# Patient Record
Sex: Male | Born: 1990 | Race: Black or African American | Hispanic: No | Marital: Single
Health system: Midwestern US, Community
[De-identification: ages and names within clinical notes are randomized; demographics above are authoritative.]

## PROBLEM LIST (undated history)

## (undated) DIAGNOSIS — R7989 Other specified abnormal findings of blood chemistry: Secondary | ICD-10-CM

## (undated) DIAGNOSIS — J45909 Unspecified asthma, uncomplicated: Secondary | ICD-10-CM

## (undated) DIAGNOSIS — E876 Hypokalemia: Secondary | ICD-10-CM

## (undated) DIAGNOSIS — G4452 New daily persistent headache (NDPH): Secondary | ICD-10-CM

---
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
## 2009-01-05 ENCOUNTER — Emergency Department (HOSPITAL_BASED_OUTPATIENT_CLINIC_OR_DEPARTMENT_OTHER): Admission: EM | Admit: 2009-01-05 | Discharge: 2009-01-05 | Payer: Self-pay | Admitting: Emergency Medicine

## 2009-03-07 ENCOUNTER — Emergency Department (HOSPITAL_BASED_OUTPATIENT_CLINIC_OR_DEPARTMENT_OTHER): Admission: EM | Admit: 2009-03-07 | Discharge: 2009-03-07 | Payer: Self-pay | Admitting: Emergency Medicine

## 2009-09-18 ENCOUNTER — Emergency Department (HOSPITAL_BASED_OUTPATIENT_CLINIC_OR_DEPARTMENT_OTHER): Admission: EM | Admit: 2009-09-18 | Discharge: 2009-09-18 | Payer: Self-pay | Admitting: Emergency Medicine

## 2010-12-06 LAB — POCT TOXICOLOGY PANEL: Tetrahydrocannabinol: POSITIVE

## 2013-04-14 ENCOUNTER — Encounter (HOSPITAL_BASED_OUTPATIENT_CLINIC_OR_DEPARTMENT_OTHER): Payer: Self-pay | Admitting: *Deleted

## 2013-04-14 ENCOUNTER — Emergency Department (HOSPITAL_BASED_OUTPATIENT_CLINIC_OR_DEPARTMENT_OTHER)
Admission: EM | Admit: 2013-04-14 | Discharge: 2013-04-14 | Disposition: A | Discharge status: Home / Self Care | Payer: 59 | Attending: Emergency Medicine | Admitting: Emergency Medicine

## 2013-04-14 DIAGNOSIS — W2209XA Striking against other stationary object, initial encounter: Secondary | ICD-10-CM | POA: Insufficient documentation

## 2013-04-14 DIAGNOSIS — Y929 Unspecified place or not applicable: Secondary | ICD-10-CM | POA: Insufficient documentation

## 2013-04-14 DIAGNOSIS — S060X0A Concussion without loss of consciousness, initial encounter: Secondary | ICD-10-CM | POA: Insufficient documentation

## 2013-04-14 DIAGNOSIS — S0993XA Unspecified injury of face, initial encounter: Secondary | ICD-10-CM | POA: Insufficient documentation

## 2013-04-14 DIAGNOSIS — Y9389 Activity, other specified: Secondary | ICD-10-CM | POA: Insufficient documentation

## 2013-04-14 NOTE — ED Notes (Signed)
Pt c/o h/a x 1 week after hitting head on wall, denies LOC

## 2013-04-14 NOTE — ED Provider Notes (Signed)
CSN: [id number]     Arrival date & time 04/14/13  1635 History     This chart was scribed for Shanna Cisco, MD by Jiles Prows, ED Scribe. The patient was seen in room MH10/MH10 and the patient's care was started at 4:57 PM.      Chief Complaint  Patient presents with  . Headache   Patient is a 22 y.o. male presenting with headaches. The history is provided by the patient and medical records. No language interpreter was used.  Headache Pain location:  L temporal and frontal Radiates to:  Does not radiate Duration:  1 week Chronicity:  New Relieved by:  Nothing Associated symptoms: no abdominal pain, no back pain, no congestion, no cough, no diarrhea, no dizziness, no pain, no fatigue, no fever, no nausea, no numbness, no photophobia and no vomiting    HPI Comments: Troy Castillo is a 22 y.o. male who presents to the Emergency Department complaining of Headache onset 6 days ago.  He states he dropped something, and hit his head on the frontal region on a wall while standing up.  He denies LOC.  He claims headache for 2 days after, and states it still does not feel normal.  He denies difficulty concentrating, but reports that "something isn't right."  Pt reports taking aleve and tylenol and with some relief.  He also complains of left ear pain onset 6 days ago.  He reports associated sore throat 5 days ago which has since resolved after taking medicine.  He claims he gets sore throats frequently.  Pt denies recent sick contacts.  Pt denies playing sports, but states he has been lifting weights.  Pt denies headache, diaphoresis, fever, chills, nausea, vomiting, diarrhea, numbness, weakness, cough, SOB and any other pain.   History reviewed. No pertinent past medical history. History reviewed. No pertinent past surgical history. History reviewed. No pertinent family history. History  Substance Use Topics  . Smoking status: Never Smoker   . Smokeless tobacco: Not on file  . Alcohol Use:  No    Review of Systems  Constitutional: Negative for fever, activity change, appetite change and fatigue.  HENT: Negative for congestion, facial swelling, rhinorrhea and trouble swallowing.   Eyes: Negative for photophobia and pain.  Respiratory: Negative for cough, chest tightness and shortness of breath.   Cardiovascular: Negative for chest pain and leg swelling.  Gastrointestinal: Negative for nausea, vomiting, abdominal pain, diarrhea and constipation.  Endocrine: Negative for polydipsia and polyuria.  Genitourinary: Negative for dysuria, urgency, decreased urine volume and difficulty urinating.  Musculoskeletal: Negative for back pain and gait problem.  Skin: Negative for color change, rash and wound.  Allergic/Immunologic: Negative for immunocompromised state.  Neurological: Positive for headaches. Negative for dizziness, facial asymmetry, speech difficulty, weakness and numbness.  Psychiatric/Behavioral: Negative for confusion, decreased concentration and agitation.   Allergies  Review of patient's allergies indicates no known allergies.  Home Medications  No current outpatient prescriptions on file. BP 137/79  Pulse 58  Temp(Src) 98.2 F (36.8 C) (Oral)  Resp 16  Ht 5\' 9"  (1.753 m)  Wt 150 lb (68.04 kg)  BMI 22.14 kg/m2  SpO2 100% Physical Exam  Constitutional: He is oriented to person, place, and time. He appears well-developed and well-nourished. No distress.  HENT:  Head: Normocephalic and atraumatic.  Right Ear: External ear normal.  Left Ear: External ear normal.  Mouth/Throat: No oropharyngeal exudate.  Eyes: Pupils are equal, round, and reactive to light.  Neck: Normal range  of motion. Neck supple.  Cardiovascular: Normal rate, regular rhythm and normal heart sounds.  Exam reveals no gallop and no friction rub.   No murmur heard. Pulmonary/Chest: Effort normal and breath sounds normal. No respiratory distress. He has no wheezes. He has no rales.  Abdominal:  Soft. Bowel sounds are normal. He exhibits no distension and no mass. There is no tenderness. There is no rebound and no guarding.  Musculoskeletal: Normal range of motion. He exhibits no edema and no tenderness.  Neurological: He is alert and oriented to person, place, and time. No cranial nerve deficit.  Skin: Skin is warm and dry.  Psychiatric: He has a normal mood and affect.   ED Course   Procedures (including critical care time) DIAGNOSTIC STUDIES: Filed Vitals:   04/14/13 1639  BP: 137/79  Pulse: 58  Temp: 98.2 F (36.8 C)  TempSrc: Oral  Resp: 16  Height: 5\' 9"  (1.753 m)  Weight: 150 lb (68.04 kg)  SpO2: 100%   COORDINATION OF CARE: 5:04 PM - Discussed ED treatment with pt at bedside including mild concussion discussion and pt agrees.  Advised that the best treatment for concussion is rest and time.  Advised pt to resume normal activities if he feels fine in a week.  Advised to follow up with PCP in a week if he feels worse.  Advised to follow up if he has numbness, weakness, or other specific changes.  Suggested tylenol for returned headache.  Labs Reviewed - No data to display No results found. No diagnosis found.  MDM   Pt is a 22 y.o. male with Pmhx as above who presents with h/a after hitting head on wall 6 days ago. H/a resolved, but pt "doesn't feel right", is worried he has a concussion.  No LOC, no focal neuro findings on exam.  Also complaining of R ear pain w/ nml external ear, canal & TM.  I do not believe he is likely to have had an acute intracranial bleed or skull fx from fall and imaging not needed.  He may have mild concussion.  Will rec avoiding contact sports, video games, strenuous reading/computer use for 1 week.  Return precautions given for new or worsening symptoms.   1. Concussion, without loss of consciousness, initial encounter      I personally performed the services described in this documentation, which was scribed in my presence. The  recorded information has been reviewed and is accurate.  Shanna Cisco, MD 04/15/13 [phone]

## 2014-08-30 ENCOUNTER — Emergency Department (HOSPITAL_BASED_OUTPATIENT_CLINIC_OR_DEPARTMENT_OTHER): Payer: 59

## 2014-08-30 ENCOUNTER — Encounter (HOSPITAL_BASED_OUTPATIENT_CLINIC_OR_DEPARTMENT_OTHER): Payer: Self-pay | Admitting: *Deleted

## 2014-08-30 ENCOUNTER — Emergency Department (HOSPITAL_BASED_OUTPATIENT_CLINIC_OR_DEPARTMENT_OTHER)
Admission: EM | Admit: 2014-08-30 | Discharge: 2014-08-30 | Disposition: A | Discharge status: Home / Self Care | Payer: 59 | Attending: Emergency Medicine | Admitting: Emergency Medicine

## 2014-08-30 DIAGNOSIS — J45901 Unspecified asthma with (acute) exacerbation: Secondary | ICD-10-CM | POA: Diagnosis not present

## 2014-08-30 DIAGNOSIS — J069 Acute upper respiratory infection, unspecified: Secondary | ICD-10-CM | POA: Diagnosis not present

## 2014-08-30 DIAGNOSIS — Z79899 Other long term (current) drug therapy: Secondary | ICD-10-CM | POA: Diagnosis not present

## 2014-08-30 DIAGNOSIS — Z720 Tobacco use: Secondary | ICD-10-CM | POA: Insufficient documentation

## 2014-08-30 DIAGNOSIS — R0602 Shortness of breath: Secondary | ICD-10-CM | POA: Diagnosis present

## 2014-08-30 HISTORY — DX: Unspecified asthma, uncomplicated: J45.909

## 2014-08-30 LAB — D-DIMER, QUANTITATIVE: D-Dimer, Quant: <0.27 ug/mL-FEU (ref 0.00–0.48)

## 2014-08-30 MED ORDER — ALBUTEROL SULFATE (2.5 MG/3ML) 0.083% IN NEBU
2.5000 mg | INHALATION_SOLUTION | Freq: Once | RESPIRATORY_TRACT | Status: AC
  Administered 2014-08-30: 2.5 mg via RESPIRATORY_TRACT
  Filled 2014-08-30: qty 3

## 2014-08-30 MED ORDER — ALBUTEROL SULFATE HFA 108 (90 BASE) MCG/ACT IN AERS: 1.0000 | INHALATION_SPRAY | Freq: Four times a day (QID) | RESPIRATORY_TRACT | Status: AC | PRN

## 2014-08-30 MED ORDER — IPRATROPIUM-ALBUTEROL 0.5-2.5 (3) MG/3ML IN SOLN
3.0000 mL | Freq: Once | RESPIRATORY_TRACT | Status: AC
  Administered 2014-08-30: 3 mL via RESPIRATORY_TRACT
  Filled 2014-08-30: qty 3

## 2014-08-30 NOTE — ED Provider Notes (Signed)
CSN: [id number]     Arrival date & time 08/30/14  1916 History   First MD Initiated Contact with Patient 08/30/14 2123     Chief Complaint  Patient presents with  . URI   Troy Castillo is a 24 y.o. otherwise healthy male who presents to the emergency department complaining of one week of chest tightness with associated shortness of breath. The patient reports that he feels like his center of his chest is slightly tight and he feels he needs to take a deep breath to breathe. He reports his symptoms of been intermittent for the past week. He reports his symptoms are worse in a hot or stuffy room. The patient reports he flew in from [place] earlier today, but was having these symptoms prior to the flight. The patient is not a smoker. The patient denies leg swelling. Patient denies personal or family history of blood clotting disorders such as factor V Leiden, protein C or S deficiency. Patient denies personal or family history of cardiovascular disease. The patient denies fevers, chills, cough, palpitations, nasal congestion, postnasal drip, ear pain, eye pain, abdominal pain, nausea, vomiting, diarrhea, rashes, or sick contacts.  (Consider location/radiation/quality/duration/timing/severity/associated sxs/prior Treatment) HPI  Past Medical History  Diagnosis Date  . Asthma    History reviewed. No pertinent past surgical history. No family history on file. History  Substance Use Topics  . Smoking status: Current Some Day Smoker  . Smokeless tobacco: Not on file  . Alcohol Use: Yes     Comment: occasionally    Review of Systems  Constitutional: Negative for fever and chills.  HENT: Negative for congestion, ear pain, postnasal drip, rhinorrhea, sinus pressure, sore throat and trouble swallowing.   Eyes: Negative for visual disturbance.  Respiratory: Positive for chest tightness and shortness of breath. Negative for cough, wheezing and stridor.   Cardiovascular: Negative for chest pain,  palpitations and leg swelling.  Gastrointestinal: Negative for nausea, vomiting, abdominal pain and diarrhea.  Genitourinary: Negative for dysuria.  Musculoskeletal: Negative for back pain and neck pain.  Skin: Negative for rash.  Neurological: Negative for dizziness, weakness, light-headedness and headaches.  All other systems reviewed and are negative.     Allergies  Review of patient's allergies indicates no known allergies.  Home Medications   Prior to Admission medications   Medication Sig Start Date End Date Taking? Authorizing Provider  albuterol (PROVENTIL HFA;VENTOLIN HFA) 108 (90 BASE) MCG/ACT inhaler Inhale 1-2 puffs into the lungs every 6 (six) hours as needed for wheezing or shortness of breath. 08/30/14   Einar Gip Keefer Soulliere, PA-C   BP 159/64 mmHg  Pulse 84  Temp(Src) 98.3 F (36.8 C) (Oral)  Resp 18  Ht  (1.753 m)  Wt 155 lb (70.308 kg)  BMI 22.88 kg/m2  SpO2 98% Physical Exam  Constitutional: He appears well-developed and well-nourished. No distress.  Nontoxic-appearing.  HENT:  Head: Normocephalic and atraumatic.  Right Ear: External ear normal.  Left Ear: External ear normal.  Nose: Nose normal.  Mouth/Throat: Oropharynx is clear and moist. No oropharyngeal exudate.  Bilateral tympanic membranes are pearly gray without erythema or loss of landmarks.  Eyes: Conjunctivae are normal. Pupils are equal, round, and reactive to light. Right eye exhibits no discharge. Left eye exhibits no discharge.  Neck: Normal range of motion. Neck supple.  Cardiovascular: Normal rate, regular rhythm, normal heart sounds and intact distal pulses.  Exam reveals no gallop and no friction rub.   No murmur heard. Bilateral radial pulses  are intact. Bilateral posterior tibialis pulses are intact.  Pulmonary/Chest: Effort normal and breath sounds normal. No respiratory distress. He has no wheezes. He has no rales. He exhibits no tenderness.  Patient's lungs are clear to  auscultation bilaterally.  Abdominal: Soft. He exhibits no distension and no mass. There is no tenderness. There is no rebound and no guarding.  Musculoskeletal: He exhibits no edema or tenderness.  No lower extremity edema. No calf tenderness.  Lymphadenopathy:    He has no cervical adenopathy.  Neurological: He is alert. Coordination normal.  Skin: Skin is warm and dry. No rash noted. He is not diaphoretic. No erythema. No pallor.  Psychiatric: He has a normal mood and affect. His behavior is normal.  Nursing note and vitals reviewed.   ED Course  Procedures (including critical care time) Labs Review Labs Reviewed  D-DIMER, QUANTITATIVE    Imaging Review Dg Chest 2 View  08/30/2014   CLINICAL DATA:  Acute onset of shortness of breath and chest tightness. Initial encounter.  EXAM: CHEST  2 VIEW  COMPARISON:  None.  FINDINGS: The lungs are well-aerated and clear. There is no evidence of focal opacification, pleural effusion or pneumothorax.  The heart is normal in size; the mediastinal contour is within normal limits. No acute osseous abnormalities are seen.  IMPRESSION: No acute cardiopulmonary process seen.   Electronically Signed   By: Roanna Raider M.D.   On: 08/30/2014 21:54     EKG Interpretation   Date/Time:  Sunday August 30 2014 19:27:14 EST Ventricular Rate:  61 PR Interval:  154 QRS Duration: 98 QT Interval:  402 QTC Calculation: 404 R Axis:   25 Text Interpretation:  Sinus rhythm with marked sinus arrhythmia Incomplete  right bundle branch block Borderline ECG Confirmed by BEATON  MD, ROBERT  ([id number]) on 08/30/2014 9:59:02 PM      Filed Vitals:   08/30/14 1932 08/30/14 2048 08/30/14 2320  BP: 148/75  159/64  Pulse: 59  84  Temp: 98.3 F (36.8 C)    TempSrc: Oral    Resp: 18  18  Height:  (1.753 m)    Weight: 155 lb (70.308 kg)    SpO2: 100% 98% 98%     MDM   Meds given in ED:  Medications  ipratropium-albuterol (DUONEB) 0.5-2.5 (3) MG/3ML  nebulizer solution 3 mL (3 mLs Nebulization Given 08/30/14 2033)  albuterol (PROVENTIL) (2.5 MG/3ML) 0.083% nebulizer solution 2.5 mg (2.5 mg Nebulization Given 08/30/14 2033)    Discharge Medication List as of 08/30/2014 11:00 PM    START taking these medications   Details  albuterol (PROVENTIL HFA;VENTOLIN HFA) 108 (90 BASE) MCG/ACT inhaler Inhale 1-2 puffs into the lungs every 6 (six) hours as needed for wheezing or shortness of breath., Starting 08/30/2014, Until Discontinued, Print        Final diagnoses:  Upper respiratory infection   This a 24 year old male who presented to the emergency complaining of shortness of breath and chest tightness intermittently for the past week. The patient denies other cold symptoms. Patient has no DVT or PE risk factors. The patient reported minimal improvement with nebulizer treatment. Patient is afebrile and nontoxic-appearing. The patient is not tachypneic or tachycardic. Patient is not hypoxic. The patient's lungs are clear to auscultation bilaterally. The patient's EKG indicated a sinus rhythm with sinus arrhythmia. The patient's chest x-ray is unremarkable. Patient has a negative d-dimer. Patient's symptoms are likely due to viral illness. Patient would like albuterol inhaler to have at home.  The patient's prescribed albuterol inhaler. I advised the patient to follow-up with their primary care provider this week. I advised the patient to return to the emergency department with new or worsening symptoms or new concerns. The patient verbalized understanding and agreement with plan.       Lawana Chambers, PA-C 08/31/14 0150  Nelia Shi, MD 09/03/14 1320

## 2014-08-30 NOTE — Discharge Instructions (Signed)
Upper Respiratory Infection, Adult °An upper respiratory infection (URI) is also sometimes known as the common cold. The upper respiratory tract includes the nose, sinuses, throat, trachea, and bronchi. Bronchi are the airways leading to the lungs. Most people improve within 1 week, but symptoms can last up to 2 weeks. A residual cough may last even longer.  °CAUSES °Many different viruses can infect the tissues lining the upper respiratory tract. The tissues become irritated and inflamed and often become very moist. Mucus production is also common. A cold is contagious. You can easily spread the virus to others by oral contact. This includes kissing, sharing a glass, coughing, or sneezing. Touching your mouth or nose and then touching a surface, which is then touched by another person, can also spread the virus. °SYMPTOMS  °Symptoms typically develop 1 to 3 days after you come in contact with a cold virus. Symptoms vary from person to person. They may include: °· Runny nose. °· Sneezing. °· Nasal congestion. °· Sinus irritation. °· Sore throat. °· Loss of voice (laryngitis). °· Cough. °· Fatigue. °· Muscle aches. °· Loss of appetite. °· Headache. °· Low-grade fever. °DIAGNOSIS  °You might diagnose your own cold based on familiar symptoms, since most people get a cold 2 to 3 times a year. Your caregiver can confirm this based on your exam. Most importantly, your caregiver can check that your symptoms are not due to another disease such as strep throat, sinusitis, pneumonia, asthma, or epiglottitis. Blood tests, throat tests, and X-rays are not necessary to diagnose a common cold, but they may sometimes be helpful in excluding other more serious diseases. Your caregiver will decide if any further tests are required. °RISKS AND COMPLICATIONS  °You may be at risk for a more severe case of the common cold if you smoke cigarettes, have chronic heart disease (such as heart failure) or lung disease (such as asthma), or if  you have a weakened immune system. The very young and very old are also at risk for more serious infections. Bacterial sinusitis, middle ear infections, and bacterial pneumonia can complicate the common cold. The common cold can worsen asthma and chronic obstructive pulmonary disease (COPD). Sometimes, these complications can require emergency medical care and may be life-threatening. °PREVENTION  °The best way to protect against getting a cold is to practice good hygiene. Avoid oral or hand contact with people with cold symptoms. Wash your hands often if contact occurs. There is no clear evidence that vitamin C, vitamin E, echinacea, or exercise reduces the chance of developing a cold. However, it is always recommended to get plenty of rest and practice good nutrition. °TREATMENT  °Treatment is directed at relieving symptoms. There is no cure. Antibiotics are not effective, because the infection is caused by a virus, not by bacteria. Treatment may include: °· Increased fluid intake. Sports drinks offer valuable electrolytes, sugars, and fluids. °· Breathing heated mist or steam (vaporizer or shower). °· Eating chicken soup or other clear broths, and maintaining good nutrition. °· Getting plenty of rest. °· Using gargles or lozenges for comfort. °· Controlling fevers with ibuprofen or acetaminophen as directed by your caregiver. °· Increasing usage of your inhaler if you have asthma. °Zinc gel and zinc lozenges, taken in the first 24 hours of the common cold, can shorten the duration and lessen the severity of symptoms. Pain medicines may help with fever, muscle aches, and throat pain. A variety of non-prescription medicines are available to treat congestion and runny nose. Your caregiver   can make recommendations and may suggest nasal or lung inhalers for other symptoms.  HOME CARE INSTRUCTIONS   Only take over-the-counter or prescription medicines for pain, discomfort, or fever as directed by your  caregiver.  Use a warm mist humidifier or inhale steam from a shower to increase air moisture. This may keep secretions moist and make it easier to breathe.  Drink enough water and fluids to keep your urine clear or pale yellow.  Rest as needed.  Return to work when your temperature has returned to normal or as your caregiver advises. You may need to stay home longer to avoid infecting others. You can also use a face mask and careful hand washing to prevent spread of the virus. SEEK MEDICAL CARE IF:   After the first few days, you feel you are getting worse rather than better.  You need your caregiver's advice about medicines to control symptoms.  You develop chills, worsening shortness of breath, or brown or red sputum. These may be signs of pneumonia.  You develop yellow or brown nasal discharge or pain in the face, especially when you bend forward. These may be signs of sinusitis.  You develop a fever, swollen neck glands, pain with swallowing, or white areas in the back of your throat. These may be signs of strep throat. SEEK IMMEDIATE MEDICAL CARE IF:   You have a fever.  You develop severe or persistent headache, ear pain, sinus pain, or chest pain.  You develop wheezing, a prolonged cough, cough up blood, or have a change in your usual mucus (if you have chronic lung disease).  You develop sore muscles or a stiff neck. Document Released: 02/07/2001 Document Revised: 11/06/2011 Document Reviewed: 11/19/2013 [facility] Patient Information 2015 [place], [place]. This information is not intended to replace advice given to you by your health care provider. Make sure you discuss any questions you have with your health care provider. Shortness of Breath Shortness of breath means you have trouble breathing. It could also mean that you have a medical problem. You should get immediate medical care for shortness of breath. CAUSES   Not enough oxygen in the air such as with high altitudes  or a smoke-filled room.  Certain lung diseases, infections, or problems.  Heart disease or conditions, such as angina or heart failure.  Low red blood cells (anemia).  Poor physical fitness, which can cause shortness of breath when you exercise.  Chest or back injuries or stiffness.  Being overweight.  Smoking.  Anxiety, which can make you feel like you are not getting enough air. DIAGNOSIS  Serious medical problems can often be found during your physical exam. Tests may also be done to determine why you are having shortness of breath. Tests may include:  Chest X-rays.  Lung function tests.  Blood tests.  An electrocardiogram (ECG).  An ambulatory electrocardiogram. An ambulatory ECG records your heartbeat patterns over a 24-hour period.  Exercise testing.  A transthoracic echocardiogram (TTE). During echocardiography, sound waves are used to evaluate how blood flows through your heart.  A transesophageal echocardiogram (TEE).  Imaging scans. Your health care provider may not be able to find a cause for your shortness of breath after your exam. In this case, it is important to have a follow-up exam with your health care provider as directed.  TREATMENT  Treatment for shortness of breath depends on the cause of your symptoms and can vary greatly. HOME CARE INSTRUCTIONS   Do not smoke. Smoking is a common cause  of shortness of breath. If you smoke, ask for help to quit.  Avoid being around chemicals or things that may bother your breathing, such as paint fumes and dust.  Rest as needed. Slowly resume your usual activities.  If medicines were prescribed, take them as directed for the full length of time directed. This includes oxygen and any inhaled medicines.  Keep all follow-up appointments as directed by your health care provider. SEEK MEDICAL CARE IF:   Your condition does not improve in the time expected.  You have a hard time doing your normal activities even  with rest.  You have any new symptoms. SEEK IMMEDIATE MEDICAL CARE IF:   Your shortness of breath gets worse.  You feel light-headed, faint, or develop a cough not controlled with medicines.  You start coughing up blood.  You have pain with breathing.  You have chest pain or pain in your arms, shoulders, or abdomen.  You have a fever.  You are unable to walk up stairs or exercise the way you normally do. MAKE SURE YOU:  Understand these instructions.  Will watch your condition.  Will get help right away if you are not doing well or get worse. Document Released: 05/09/2001 Document Revised: 08/19/2013 Document Reviewed: 10/30/2011 [facility] Patient Information 2015 [place], [place]. This information is not intended to replace advice given to you by your health care provider. Make sure you discuss any questions you have with your health care provider.

## 2014-08-30 NOTE — ED Notes (Addendum)
C/o sob, chest tightness. Onset 1 week ago. (denies: cough, congestion, cold sx, fever, nvd, dizziness, leg pain or other sx), sx worse when in a hot or stuffy area. H/o asthma. occasional smoker. Recent flight from [place]. Alert, NAD, calm, interactive, resps e/u, speaking in clear complete sentences. LS CTA. No dyspnea noted.

## 2014-09-28 ENCOUNTER — Ambulatory Visit: Payer: Self-pay | Admitting: Cardiology

## 2015-01-27 ENCOUNTER — Encounter (HOSPITAL_COMMUNITY): Payer: Self-pay | Admitting: *Deleted

## 2015-01-27 ENCOUNTER — Emergency Department (HOSPITAL_COMMUNITY)
Admission: EM | Admit: 2015-01-27 | Discharge: 2015-01-27 | Disposition: A | Discharge status: Home / Self Care | Payer: 59 | Source: Home / Self Care | Attending: Family Medicine | Admitting: Family Medicine

## 2015-01-27 DIAGNOSIS — T148 Other injury of unspecified body region: Secondary | ICD-10-CM | POA: Diagnosis not present

## 2015-01-27 DIAGNOSIS — B070 Plantar wart: Secondary | ICD-10-CM

## 2015-01-27 DIAGNOSIS — W57XXXA Bitten or stung by nonvenomous insect and other nonvenomous arthropods, initial encounter: Secondary | ICD-10-CM

## 2015-01-27 MED ORDER — TRIAMCINOLONE ACETONIDE 0.1 % EX CREA: 1.0000 "application " | TOPICAL_CREAM | Freq: Two times a day (BID) | CUTANEOUS | Status: AC

## 2015-01-27 NOTE — Discharge Instructions (Signed)
Thank you for coming in today. Use a metatarsal pad for your foot.  I can help you place it.  Apply the cream on the bug bite.  Return as needed.    Insect Bite Mosquitoes, flies, fleas, bedbugs, and many other insects can bite. Insect bites are different from insect stings. A sting is when venom is injected into the skin. Some insect bites can transmit infectious diseases. SYMPTOMS  Insect bites usually turn red, swell, and itch for 2 to 4 days. They often go away on their own. TREATMENT  Your caregiver may prescribe antibiotic medicines if a bacterial infection develops in the bite. HOME CARE INSTRUCTIONS  Do not scratch the bite area.  Keep the bite area clean and dry. Wash the bite area thoroughly with soap and water.  Put ice or cool compresses on the bite area.  Put ice in a plastic bag.  Place a towel between your skin and the bag.  Leave the ice on for 20 minutes, 4 times a day for the first 2 to 3 days, or as directed.  You may apply a baking soda paste, cortisone cream, or calamine lotion to the bite area as directed by your caregiver. This can help reduce itching and swelling.  Only take over-the-counter or prescription medicines as directed by your caregiver.  If you are given antibiotics, take them as directed. Finish them even if you start to feel better. You may need a tetanus shot if:  You cannot remember when you had your last tetanus shot.  You have never had a tetanus shot.  The injury broke your skin. If you get a tetanus shot, your arm may swell, get red, and feel warm to the touch. This is common and not a problem. If you need a tetanus shot and you choose not to have one, there is a rare chance of getting tetanus. Sickness from tetanus can be serious. SEEK IMMEDIATE MEDICAL CARE IF:   You have increased pain, redness, or swelling in the bite area.  You see a red line on the skin coming from the bite.  You have a fever.  You have joint  pain.  You have a headache or neck pain.  You have unusual weakness.  You have a rash.  You have chest pain or shortness of breath.  You have abdominal pain, nausea, or vomiting.  You feel unusually tired or sleepy. MAKE SURE YOU:   Understand these instructions.  Will watch your condition.  Will get help right away if you are not doing well or get worse. Document Released: 09/21/2004 Document Revised: 11/06/2011 Document Reviewed: 03/15/2011 [facility] Patient Information 2015 [place], [place]. This information is not intended to replace advice given to you by your health care provider. Make sure you discuss any questions you have with your health care provider.  Plantar Warts Warts are benign (noncancerous) growths of the outer skin layer. They can occur at any time in life but are most common during childhood and the teen years. Warts can occur on many skin surfaces of the body. When they occur on the underside (sole) of your foot they are called plantar warts. They often emerge in groups with several small warts encircling a larger growth. CAUSES  Human papillomavirus (HPV) is the cause of plantar warts. HPV attacks a break in the skin of the foot. Walking barefoot can lead to exposure to the wart virus. Plantar warts tend to develop over areas of pressure such as the heel and ball  of the foot. Plantar warts often grow into the deeper layers of skin. They may spread to other areas of the sole but cannot spread to other areas of the body. SYMPTOMS  You may also notice a growth on the undersurface of your foot. The wart may grow directly into the sole of the foot, or rise above the surface of the skin on the sole of the foot, or both. They are most often flat from pressure. Warts generally do not cause itching but may cause pain in the area of the wart when you put weight on your foot. DIAGNOSIS  Diagnosis is made by physical examination. This means your caregiver discovers it while  examining your foot.  TREATMENT  There are many ways to treat plantar warts. However, warts are very tough. Sometimes it is difficult to treat them so that they go away completely and do not grow back. Any treatment must be done regularly to work. If left untreated, most plantar warts will eventually disappear over a period of one to two years. Treatments you can do at home include:  Putting duct tape over the top of the wart (occlusion) has been found to be effective over several months. The duct tape should be removed each night and reapplied until the wart has disappeared.  Placing over-the-counter medications on top of the wart to help kill the wart virus and remove the wart tissue (salicylic acid, cantharidin, and dichloroacetic acid) are useful. These are called keratolytic agents. These medications make the skin soft and gradually layers will shed away. These compounds are usually placed on the wart each night and then covered with a bandage. They are also available in premedicated bandage form. Avoid surrounding skin when applying these liquids as these medications can burn healthy skin. The treatment may take several months of nightly use to be effective.  Cryotherapy to freeze the wart has recently become available over-the-counter for children 4 years and older. This system makes use of a soft narrow applicator connected to a bottle of compressed cold liquid that is applied directly to the wart. This medication can burn healthy skin and should be used with caution.  As with all over-the-counter medications, read the directions carefully before use. Treatments generally done in your caregiver's office include:  Some aggressive treatments may cause discomfort, discoloration, and scarring of the surrounding skin. The risks and benefits of treatment should be discussed with your caregiver.  Freezing the wart with liquid nitrogen (cryotherapy, see above).  Burning the wart with use of very  high heat (cautery).  Injecting medication into the wart.  Surgically removing or laser treatment of the wart.  Your caregiver may refer you to a dermatologist for difficult to treat large-sized warts or large numbers of warts. HOME CARE INSTRUCTIONS   Soak the affected area in warm water. Dry the area completely when you are done. Remove the top layer of softened skin, then apply the chosen topical medication and reapply a bandage.  Remove the bandage daily and file excess wart tissue (pumice stone works well for this purpose). Repeat the entire process daily or every other day for weeks until the plantar wart disappears.  Several brands of salicylic acid pads are available as over-the-counter remedies.  Pain can be relieved by wearing a donut bandage. This is a bandage with a hole in it. The bandage is put on with the hole over the wart. This helps take the pressure off the wart and gives pain relief. To help prevent  plantar warts:  Wear shoes and socks and change them daily.  Keep feet clean and dry.  Check your feet and your children's feet regularly.  Avoid direct contact with warts on other people.  Have growths or changes on your skin checked by your caregiver. Document Released: 11/04/2003 Document Revised: 12/29/2013 Document Reviewed: 04/14/2009 [facility] Patient Information 2015 [place]. This information is not intended to replace advice given to you by your health care provider. Make sure you discuss any questions you have with your health care provider.

## 2015-01-27 NOTE — ED Notes (Signed)
Pt has  2  Issues he  Has  A  reddned  Rash  Area  On r  Upper     Chest  That  He  Noticed   sev  Days  Ago    He   Also  Reports  A  Blister  /callus   Bottom  Of  Left  Foot  That  Has  Been there  For     About  6  Months      Pt   Appears  In  No  Acute  Severe  Distress

## 2015-01-27 NOTE — ED Provider Notes (Signed)
Troy ApleyMatthew D Castillo is a 24 y.o. male who presents to Urgent Care today for rash on chest. Patient has a small irritated area on his right side of his chest present for the last few days. It is tingling. It does not hurt. No fevers or chills. No treatment tried yet.  Additionally he notes a painful area on his plantar foot. This is been present for 6 months and is tender with ambulation. No treatment tried yet.   Past Medical History  Diagnosis Date  . Asthma    History reviewed. No pertinent past surgical history. History  Substance Use Topics  . Smoking status: Current Some Day Smoker  . Smokeless tobacco: Not on file  . Alcohol Use: Yes     Comment: occasionally   ROS as above Medications: No current facility-administered medications for this encounter.   Current Outpatient Prescriptions  Medication Sig Dispense Refill  . albuterol (PROVENTIL HFA;VENTOLIN HFA) 108 (90 BASE) MCG/ACT inhaler Inhale 1-2 puffs into the lungs every 6 (six) hours as needed for wheezing or shortness of breath. 1 Inhaler 0  . triamcinolone cream (KENALOG) 0.1 % Apply 1 application topically 2 (two) times daily. 30 g 0   No Known Allergies   Exam:  BP 134/78 mmHg  Pulse 65  Temp(Src) 98.3 F (36.8 C) (Oral)  Resp 16  SpO2 98%   Gen: Well NAD HEENT: EOMI,  MMM Lungs: Normal work of breathing. CTABL Heart: RRR no MRG Abd: NABS, Soft. Nondistended, Nontender Exts: Brisk capillary refill, warm and well perfused.  Skin: Small erythematous papule right chest. Nontender. She once or induration. Foot: Plantar foot with plantar wart present at the skin just superficial to the second MTP. Mildly tender  No results found for this or any previous visit (from the past 24 hour(s)). No results found.  Assessment and Plan: 24 y.o. male with  1) insect bite likely right-sided chest. Treat with triamcinolone cream 2) plantar wart. Follow-up with podiatry  Discussed warning signs or symptoms. Please see  discharge instructions. Patient expresses understanding.     Rodolph BongEvan S Lelan Cush, MD 01/27/15 [phone]

## 2015-02-22 NOTE — ED Notes (Signed)
Patient called w questions about to whom he was referred on his visit. Called patient, and discussed

## 2015-03-04 ENCOUNTER — Ambulatory Visit (INDEPENDENT_AMBULATORY_CARE_PROVIDER_SITE_OTHER): Payer: 59 | Admitting: Podiatry

## 2015-03-04 ENCOUNTER — Encounter: Payer: Self-pay | Admitting: Podiatry

## 2015-03-04 VITALS — BP 121/85 | HR 61 | Resp 12

## 2015-03-04 DIAGNOSIS — B070 Plantar wart: Secondary | ICD-10-CM | POA: Diagnosis not present

## 2015-03-04 DIAGNOSIS — Q667 Congenital pes cavus: Secondary | ICD-10-CM

## 2015-03-04 DIAGNOSIS — B079 Viral wart, unspecified: Secondary | ICD-10-CM

## 2015-03-04 DIAGNOSIS — M216X9 Other acquired deformities of unspecified foot: Secondary | ICD-10-CM

## 2015-03-04 DIAGNOSIS — B078 Other viral warts: Secondary | ICD-10-CM

## 2015-03-04 NOTE — Progress Notes (Signed)
   Subjective:    Patient ID: Troy ApleyMatthew D Tonnesen, male    DOB: 08/13/1991, 24 y.o.   MRN: [id number]  HPI Patient presents callous on the bottom of L foot which has been giving patient pain for over a year. Went to urgent care for it a month ago and started wearing insoles which have alleviated some discomfort.   Review of Systems  Skin: Positive for color change.  All other systems reviewed and are negative.      Objective:   Physical Exam        Assessment & Plan:

## 2015-03-04 NOTE — Progress Notes (Signed)
Subjective:     Patient ID: Troy Castillo, male   DOB: 04/05/1991, 24 y.o.   MRN: [id number]  HPI patient states I been getting a lot of pain on the ball of my left foot and there is a lesion there and I'm not sure what caused it but it's been there around a year but worse recently   Review of Systems  All other systems reviewed and are negative.      Objective:   Physical Exam  Constitutional: He is oriented to person, place, and time.  Cardiovascular: Intact distal pulses.   Musculoskeletal: Normal range of motion.  Neurological: He is oriented to person, place, and time.  Skin: Skin is warm.  Nursing note and vitals reviewed.  neurovascular status intact muscle strength adequate range of motion within normal limits with lesion on the plantar aspect left second metatarsal that sore when pressed and makes walking difficult. It does have a lucent core but also has inflammation when I pressed from a lateral direction and large tissue formation surrounding it     Assessment:     Lesion secondary to position of the metatarsal with possible verruca plantaris present    Plan:     H&P and condition and education rendered to patient. Using sharp sterile instrumentation debridement accomplished and I applied sterile dressing around the area and applied a chemical agent to the lesion to try to kill any fibrous tissue. Patient will be seen back when it becomes symptomatic again

## 2017-07-30 ENCOUNTER — Ambulatory Visit
Admission: RE | Admit: 2017-07-30 | Discharge: 2017-07-30 | Disposition: A | Discharge status: Home / Self Care | Payer: No Typology Code available for payment source | Source: Ambulatory Visit | Attending: Internal Medicine | Admitting: Internal Medicine

## 2017-07-30 ENCOUNTER — Other Ambulatory Visit: Payer: 59

## 2017-07-30 ENCOUNTER — Other Ambulatory Visit: Payer: Self-pay | Admitting: Internal Medicine

## 2017-07-30 DIAGNOSIS — S0990XA Unspecified injury of head, initial encounter: Secondary | ICD-10-CM

## 2019-01-27 DIAGNOSIS — B9681 Helicobacter pylori [H. pylori] as the cause of diseases classified elsewhere: Secondary | ICD-10-CM

## 2019-02-11 ENCOUNTER — Telehealth
Admit: 2019-02-11 | Discharge: 2019-02-11 | Payer: PRIVATE HEALTH INSURANCE | Attending: Family Medicine | Primary: Family Medicine

## 2019-02-11 ENCOUNTER — Telehealth: Attending: Family Medicine | Primary: Family Medicine

## 2019-02-11 DIAGNOSIS — K2970 Gastritis, unspecified, without bleeding: Secondary | ICD-10-CM

## 2019-02-11 DIAGNOSIS — B9681 Helicobacter pylori [H. pylori] as the cause of diseases classified elsewhere: Secondary | ICD-10-CM

## 2019-02-11 NOTE — Progress Notes (Signed)
Yhair Pourciau presents today for   Chief Complaint   Patient presents with   . GI Problem     Follow up GI concerns, currently under tx H-pylori with Gastro.        Is someone accompanying this pt? No    Is the patient using any DME equipment during OV? no    Depression Screening:  3 most recent PHQ Screens 02/11/2019   Little interest or pleasure in doing things Not at all   Feeling down, depressed, irritable, or hopeless Not at all   Total Score PHQ 2 0       Learning Assessment:  No flowsheet data found.    Abuse Screening:  Abuse Screening Questionnaire 02/11/2019   Do you ever feel afraid of your partner? N   Are you in a relationship with someone who physically or mentally threatens you? N   Is it safe for you to go home? Y         Health Maintenance Due   Topic Date Due   . DTaP/Tdap/Td series (1 - Tdap) 05/29/2011   .      Health Maintenance reviewed and discussed and ordered per Provider.    Ingram Meece is updated on all HM    Coordination of Care  1. Have you been to the ER, urgent care clinic since your last visit?  Hospitalized since your last visit? New pt    2. Have you seen or consulted any other health care providers outside of the [facility] since your last visit?  Include any pap smears or colon screening. New pt; seen by gi.

## 2019-02-11 NOTE — Progress Notes (Signed)
Phillip Valdez is a 28 y.o. male who was seen by synchronous (real-time) audio-video technology on 02/11/2019.      Consent: Phillip Valdez, who was seen by synchronous (real-time) audio-video technology, and/or his healthcare decision maker, is aware that this patient-initiated, Telehealth encounter on 02/11/2019 is a billable service, with coverage as determined by his insurance carrier. He is aware that he may receive a bill and has provided verbal consent to proceed: Yes.       Assessment & Plan:   Diagnoses and all orders for this visit:    1. Helicobacter pylori gastritis  Cont triple antibiotic therapy as per gi.  Cont omeprazole as per gi.  Restart pepcid.  sched f/u with gi.       The complexity of medical decision making for this visit is moderate   Follow-up and Dispositions    ?? Return in about 4 weeks (around 03/11/2019) for gastritis.           712  Subjective:   Phillip Valdez is a 28 y.o. male who was seen for GI Problem (Follow up GI concerns, currently under tx H-pylori with Gastro. )    Patient contacted via [email or web address].      Pt had been seen at an urgent care for throat pain but cont to have abd sx.  He was seen by gi.  He did get an egd.  Pt was dx with h. Pylori.  He is on triple antibiotic tx.  His throat pain is now starting again.  Pt is not taking pepcid or protonix anymore.  Pt notes that his stomach pain has improved.  There is no burning sensation.  Pt has told gi that he would prefer to postpone colonoscopy for now.  Pt's next f/u with gi has not yet been set.      Prior to Admission medications    Medication Sig Start Date End Date Taking? Authorizing Provider   clarithromycin (BIAXIN) 500 mg tablet TK 1 T PO  Q 12 H FOR 14 DAYS TAT 02/05/19  Yes Provider, Historical   amoxicillin (AMOXIL) 500 mg capsule TK 2 CS PO Q 12 H FOR 14 DAYS TAT 02/05/19  Yes Provider, Historical   omeprazole (PRILOSEC) 20 mg capsule TK 1 C PO BID 30 MIN TO 1 HOUR AC FOR 2 WKS 02/05/19  Yes Provider, Historical    famotidine (PEPCID) 20 mg tablet TK 1 T PO BID 01/23/19   Provider, Historical     No Known Allergies    Patient Active Problem List   Diagnosis Code   ??? Asthma J45.909   ??? Allergic rhinitis J30.9   ??? Mild intermittent asthma J45.20   ??? Helicobacter pylori gastritis K29.70, B96.81     Current Outpatient Medications   Medication Sig Dispense Refill   ??? clarithromycin (BIAXIN) 500 mg tablet TK 1 T PO  Q 12 H FOR 14 DAYS TAT     ??? amoxicillin (AMOXIL) 500 mg capsule TK 2 CS PO Q 12 H FOR 14 DAYS TAT     ??? omeprazole (PRILOSEC) 20 mg capsule TK 1 C PO BID 30 MIN TO 1 HOUR AC FOR 2 WKS     ??? famotidine (PEPCID) 20 mg tablet TK 1 T PO BID       No Known Allergies  Past Medical History:   Diagnosis Date   ??? Allergic rhinitis    ??? Concussion    ??? Helicobacter pylori gastritis 01/2019   ??? Mild intermittent asthma    ???  Tension headache      History reviewed. No pertinent surgical history.  Family History   Problem Relation Age of Onset   ??? Migraines Mother    ??? No Known Problems Father    ??? No Known Problems Sister    ??? Colon Cancer Maternal Grandfather    ??? Breast Cancer Paternal Grandmother    ??? Alzheimer Paternal Grandfather      Social History     Tobacco Use   ??? Smoking status: Never Smoker   ??? Smokeless tobacco: Never Used   Substance Use Topics   ??? Alcohol use: Yes     Frequency: Monthly or less       Review of Systems   Constitutional: Negative.    HENT:        Burning sensation in throat   Respiratory: Negative.    Cardiovascular: Negative.    Gastrointestinal: Negative for abdominal pain and heartburn.   All other systems reviewed and are negative.        Objective:   There were no vitals taken for this visit.   General: alert, cooperative, no distress   Mental  status: normal mood, behavior, speech, dress, motor activity, and thought processes, able to follow commands   HENT: NCAT   Neck: no visualized mass   Resp: no respiratory distress   Neuro: no gross deficits   Skin: no discoloration or lesions of concern on  visible areas   Psychiatric: normal affect, consistent with stated mood, no evidence of hallucinations     Additional exam findings: none      We discussed the expected course, resolution and complications of the diagnosis(es) in detail.  Medication risks, benefits, costs, interactions, and alternatives were discussed as indicated.  I advised him to contact the office if his condition worsens, changes or fails to improve as anticipated. He expressed understanding with the diagnosis(es) and plan.     Phillip Valdez is a 28 y.o. male who was evaluated by a video visit encounter for concerns as above. Patient identification was verified prior to start of the visit. A caregiver was present when appropriate. Due to this being a Scientist, physiological (During ZOXWR-60 public health emergency), evaluation of the following organ systems was limited: Vitals/Constitutional/EENT/Resp/CV/GI/GU/MS/Neuro/Skin/Heme-Lymph-Imm.  Pursuant to the emergency declaration under the [place], 1135 waiver authority and the [employer] Act, this Virtual  Visit was conducted, with patient's (and/or legal guardian's) consent, to reduce the patient's risk of exposure to COVID-19 and provide necessary medical care.     Services were provided through a video synchronous discussion virtually to substitute for in-person clinic visit.  Patient and provider were located at their individual homes.      Albin Felling, MD

## 2019-02-11 NOTE — Progress Notes (Signed)
Phillip Valdez is a 28 y.o. male who was seen by synchronous (real-time) audio-video technology on 02/11/2019.      Consent: Phillip Valdez, who was seen by synchronous (real-time) audio-video technology, and/or his healthcare decision maker, is aware that this patient-initiated, Telehealth encounter on 02/11/2019 is a billable service, with coverage as determined by his insurance carrier. He is aware that he may receive a bill and has provided verbal consent to proceed: Yes.       Assessment & Plan:   Diagnoses and all orders for this visit:    1. Helicobacter pylori gastritis  Cont triple antibiotic therapy as per gi.  Cont omeprazole as per gi.  Restart pepcid.  sched f/u with gi.       The complexity of medical decision making for this visit is moderate   Follow-up and Dispositions    ?? Return in about 4 weeks (around 03/11/2019) for gastritis.           712  Subjective:   Phillip Valdez is a 28 y.o. male who was seen for GI Problem (Follow up GI concerns, currently under tx H-pylori with Gastro. )    Patient contacted via [email or web address].      Pt had been seen at an urgent care for throat pain but cont to have abd sx.  He was seen by gi.  He did get an egd.  Pt was dx with h. Pylori.  He is on triple antibiotic tx.  His throat pain is now starting again.  Pt is not taking pepcid or protonix anymore.  Pt notes that his stomach pain has improved.  There is no burning sensation.  Pt has told gi that he would prefer to postpone colonoscopy for now.  Pt's next f/u with gi has not yet been set.      Prior to Admission medications    Medication Sig Start Date End Date Taking? Authorizing Provider   clarithromycin (BIAXIN) 500 mg tablet TK 1 T PO  Q 12 H FOR 14 DAYS TAT 02/05/19  Yes Provider, Historical   amoxicillin (AMOXIL) 500 mg capsule TK 2 CS PO Q 12 H FOR 14 DAYS TAT 02/05/19  Yes Provider, Historical   omeprazole (PRILOSEC) 20 mg capsule TK 1 C PO BID 30 MIN TO 1 HOUR AC FOR 2 WKS 02/05/19  Yes Provider, Historical    famotidine (PEPCID) 20 mg tablet TK 1 T PO BID 01/23/19   Provider, Historical     No Known Allergies    Patient Active Problem List   Diagnosis Code   ??? Asthma J45.909   ??? Allergic rhinitis J30.9   ??? Mild intermittent asthma J45.20   ??? Helicobacter pylori gastritis K29.70, B96.81     Current Outpatient Medications   Medication Sig Dispense Refill   ??? clarithromycin (BIAXIN) 500 mg tablet TK 1 T PO  Q 12 H FOR 14 DAYS TAT     ??? amoxicillin (AMOXIL) 500 mg capsule TK 2 CS PO Q 12 H FOR 14 DAYS TAT     ??? omeprazole (PRILOSEC) 20 mg capsule TK 1 C PO BID 30 MIN TO 1 HOUR AC FOR 2 WKS     ??? famotidine (PEPCID) 20 mg tablet TK 1 T PO BID       No Known Allergies  Past Medical History:   Diagnosis Date   ??? Allergic rhinitis    ??? Concussion    ??? Helicobacter pylori gastritis 01/2019   ??? Mild intermittent asthma    ???  Tension headache      History reviewed. No pertinent surgical history.  Family History   Problem Relation Age of Onset   ??? Migraines Mother    ??? No Known Problems Father    ??? No Known Problems Sister    ??? Colon Cancer Maternal Grandfather    ??? Breast Cancer Paternal Grandmother    ??? Alzheimer Paternal Grandfather      Social History     Tobacco Use   ??? Smoking status: Never Smoker   ??? Smokeless tobacco: Never Used   Substance Use Topics   ??? Alcohol use: Yes     Frequency: Monthly or less       Review of Systems   Constitutional: Negative.    HENT:        Burning sensation in throat   Respiratory: Negative.    Cardiovascular: Negative.    Gastrointestinal: Negative for abdominal pain and heartburn.   All other systems reviewed and are negative.        Objective:   There were no vitals taken for this visit.   General: alert, cooperative, no distress   Mental  status: normal mood, behavior, speech, dress, motor activity, and thought processes, able to follow commands   HENT: NCAT   Neck: no visualized mass   Resp: no respiratory distress   Neuro: no gross deficits    Skin: no discoloration or lesions of concern on visible areas   Psychiatric: normal affect, consistent with stated mood, no evidence of hallucinations     Additional exam findings: none      We discussed the expected course, resolution and complications of the diagnosis(es) in detail.  Medication risks, benefits, costs, interactions, and alternatives were discussed as indicated.  I advised him to contact the office if his condition worsens, changes or fails to improve as anticipated. He expressed understanding with the diagnosis(es) and plan.     Phillip Valdez is a 28 y.o. male who was evaluated by a video visit encounter for concerns as above. Patient identification was verified prior to start of the visit. A caregiver was present when appropriate. Due to this being a Scientist, physiological (During HYWVP-71 public health emergency), evaluation of the following organ systems was limited: Vitals/Constitutional/EENT/Resp/CV/GI/GU/MS/Neuro/Skin/Heme-Lymph-Imm.  Pursuant to the emergency declaration under the [place], 1135 waiver authority and the [employer] Act, this Virtual  Visit was conducted, with patient's (and/or legal guardian's) consent, to reduce the patient's risk of exposure to COVID-19 and provide necessary medical care.     Services were provided through a video synchronous discussion virtually to substitute for in-person clinic visit.  Patient and provider were located at their individual homes.      Albin Felling, MD

## 2019-02-11 NOTE — Progress Notes (Signed)
Phillip Valdez presents today for   Chief Complaint   Patient presents with   ??? GI Problem     Follow up GI concerns, currently under tx H-pylori with Gastro.        Is someone accompanying this pt? No    Is the patient using any DME equipment during OV? no    Depression Screening:  3 most recent PHQ Screens 02/11/2019   Little interest or pleasure in doing things Not at all   Feeling down, depressed, irritable, or hopeless Not at all   Total Score PHQ 2 0       Learning Assessment:  No flowsheet data found.    Abuse Screening:  Abuse Screening Questionnaire 02/11/2019   Do you ever feel afraid of your partner? N   Are you in a relationship with someone who physically or mentally threatens you? N   Is it safe for you to go home? Y         Health Maintenance Due   Topic Date Due   ??? DTaP/Tdap/Td series (1 - Tdap) 05/29/2011   .      Health Maintenance reviewed and discussed and ordered per Provider.    Phillip Valdez is updated on all HM    Coordination of Care  1. Have you been to the ER, urgent care clinic since your last visit?  Hospitalized since your last visit? New pt    2. Have you seen or consulted any other health care providers outside of the [facility] since your last visit?  Include any pap smears or colon screening. New pt; seen by gi.

## 2019-02-14 NOTE — Progress Notes (Signed)
Patient called with c/o constant urge to yawn with some SOB yesterday.  Cardiac triage was taken and given to Dr Luiz Blare for review. Dr Luiz Blare instructed me to advise the patient to take 4 puffs of his inhaler every 2 hours today while awake and on Sat 2 puffs every 2-4 hours as needed. He wanted to know if he should be tested for COVID 19. Advised him that he could go to one of the testing sites if he felt the need.

## 2019-02-14 NOTE — Progress Notes (Signed)
Patient called with c/o constant urge to yawn with some SOB yesterday.  Cardiac triage was taken and given to Dr Graves for review. Dr Graves instructed me to advise the patient to take 4 puffs of his inhaler every 2 hours today while awake and on Sat 2 puffs every 2-4 hours as needed. He wanted to know if he should be tested for COVID 19. Advised him that he could go to one of the testing sites if he felt the need.

## 2019-02-17 NOTE — Telephone Encounter (Signed)
Pt called and stated that he was taking potassium chloride and also he wanted to give a report regarding taking Albuterol breathing treatment. Pt stated that he had a little shortness of breath after treatment, pt would like to know if he should continue taking medication as needed or should he be on some type of regiment. Please advise.

## 2019-02-20 NOTE — Telephone Encounter (Signed)
States he hasnt taken any albuterol since last week and states that he had improvement and wanted to know if it was okay to continue taking albuterol. Pt advised to take as prescribed. Pt also informed that he has tested negative for COVID-19. Sent to provider for review.

## 2019-02-20 NOTE — Telephone Encounter (Signed)
I left a message for the patient to return my call.

## 2019-03-03 ENCOUNTER — Telehealth
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
## 2019-03-03 NOTE — Telephone Encounter (Signed)
Returned call to patient. Reviewed chart. Informed patient to scheduled his follow up visit with PCP related to gastritis and also to schedule his follow up with GI. No signs of distress to note. Patient states that his is going to call GI to follow up and will call back to this office to schedule appointment.

## 2019-03-04 NOTE — Telephone Encounter (Signed)
Pt has been informed.

## 2019-03-05 NOTE — Telephone Encounter (Signed)
Sent to provider for advice.

## 2019-03-05 NOTE — Telephone Encounter (Signed)
Pt called back and stated that after he saw the GI provider yesterday they recommended that he has a colonoscopy done due to the issues he is continuing to have, and also pt would like a recheck for potassium levels. Pt stated that he would like to know how his PCP feels about having a colonoscopy done now due to his age. Please advise.

## 2019-03-06 NOTE — Telephone Encounter (Signed)
Called [address] [place] and spoke with receptionist Ross. She stated that patient was not seen in office but had a procedure done on June 4th and pt spoke with office over the phone. Office has faxed notes over for procedure that was completed.

## 2019-03-07 NOTE — Telephone Encounter (Signed)
Patient has been informed about GI but would like orders to be placed to have potassium levels rechecked, please advise.

## 2019-03-17 NOTE — Telephone Encounter (Signed)
Pt called and stated that he had some questions about getting rechecked for H.Pilori. Pt stated that his gastro informed him that he may need to get rechecked, pt would like to know if provider feels that is a good choice. Please advise.

## 2019-03-18 NOTE — Telephone Encounter (Signed)
I left a message for the patient to return my call.

## 2019-03-19 ENCOUNTER — Encounter: Attending: Family Medicine | Primary: Family Medicine

## 2019-03-20 NOTE — Telephone Encounter (Signed)
Noted. Will note in original conversation thread.

## 2019-03-21 ENCOUNTER — Telehealth

## 2019-03-21 NOTE — Telephone Encounter (Signed)
Referral to Nephrology created per provider to to pt elevated creatinine. Referral has been ordered.

## 2019-03-24 NOTE — Telephone Encounter (Signed)
Informed pt that Dr Luiz Blare suggested Dr. Betha Loa on the [place] for his nephrology referral.

## 2019-03-24 NOTE — Telephone Encounter (Signed)
Callled patient 03/21/19 to inform of elevated lab value and to inform of referral to Nephrology.

## 2019-04-16 ENCOUNTER — Encounter

## 2019-04-22 ENCOUNTER — Encounter: Attending: Family Medicine | Primary: Family Medicine

## 2019-10-06 ENCOUNTER — Encounter

## 2019-11-17 ENCOUNTER — Ambulatory Visit: Payer: Self-pay

## 2020-05-18 ENCOUNTER — Other Ambulatory Visit: Payer: Self-pay | Admitting: Orthopedic Surgery

## 2020-05-18 DIAGNOSIS — M545 Low back pain, unspecified: Secondary | ICD-10-CM

## 2020-05-23 ENCOUNTER — Ambulatory Visit
Admission: RE | Admit: 2020-05-23 | Discharge: 2020-05-23 | Disposition: A | Discharge status: Home / Self Care | Payer: BLUE CROSS/BLUE SHIELD | Source: Ambulatory Visit | Attending: Orthopedic Surgery | Admitting: Orthopedic Surgery

## 2020-05-23 DIAGNOSIS — M545 Low back pain, unspecified: Secondary | ICD-10-CM

## 2021-02-12 ENCOUNTER — Emergency Department (HOSPITAL_BASED_OUTPATIENT_CLINIC_OR_DEPARTMENT_OTHER): Payer: BC Managed Care – PPO

## 2021-02-12 ENCOUNTER — Encounter (HOSPITAL_BASED_OUTPATIENT_CLINIC_OR_DEPARTMENT_OTHER): Payer: Self-pay | Admitting: Emergency Medicine

## 2021-02-12 ENCOUNTER — Emergency Department (HOSPITAL_BASED_OUTPATIENT_CLINIC_OR_DEPARTMENT_OTHER)
Admission: EM | Admit: 2021-02-12 | Discharge: 2021-02-12 | Disposition: A | Discharge status: Home / Self Care | Payer: BC Managed Care – PPO | Attending: Emergency Medicine | Admitting: Emergency Medicine

## 2021-02-12 ENCOUNTER — Other Ambulatory Visit: Payer: Self-pay

## 2021-02-12 DIAGNOSIS — M5441 Lumbago with sciatica, right side: Secondary | ICD-10-CM | POA: Diagnosis not present

## 2021-02-12 DIAGNOSIS — F17200 Nicotine dependence, unspecified, uncomplicated: Secondary | ICD-10-CM | POA: Insufficient documentation

## 2021-02-12 DIAGNOSIS — R519 Headache, unspecified: Secondary | ICD-10-CM | POA: Insufficient documentation

## 2021-02-12 DIAGNOSIS — M542 Cervicalgia: Secondary | ICD-10-CM | POA: Insufficient documentation

## 2021-02-12 DIAGNOSIS — M545 Low back pain, unspecified: Secondary | ICD-10-CM | POA: Diagnosis present

## 2021-02-12 DIAGNOSIS — M25551 Pain in right hip: Secondary | ICD-10-CM | POA: Insufficient documentation

## 2021-02-12 DIAGNOSIS — R202 Paresthesia of skin: Secondary | ICD-10-CM | POA: Insufficient documentation

## 2021-02-12 DIAGNOSIS — J45909 Unspecified asthma, uncomplicated: Secondary | ICD-10-CM | POA: Diagnosis not present
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
## 2021-02-12 NOTE — ED Notes (Signed)
Patient transported to MRI 

## 2021-02-12 NOTE — ED Notes (Signed)
Patient ambulated to X-ray 
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
## 2021-02-12 NOTE — ED Notes (Signed)
Pt states had xr yesterday, started on muscle relaxer and steroids.  Requesting MRI of spine today due to previous back injury and concussion
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
## 2021-05-03 ENCOUNTER — Encounter (INDEPENDENT_AMBULATORY_CARE_PROVIDER_SITE_OTHER): Payer: Self-pay

## 2021-05-03 ENCOUNTER — Encounter (INDEPENDENT_AMBULATORY_CARE_PROVIDER_SITE_OTHER): Payer: BC Managed Care – PPO | Admitting: Ophthalmology

## 2021-06-27 NOTE — Progress Notes (Shared)
[facility] - Clinic Note  06/29/2021     CHIEF COMPLAINT Patient presents for No chief complaint on file.   HISTORY OF PRESENT ILLNESS: Troy Castillo is a 30 y.o. male who presents to the clinic today for:     Referring physician: Mateo Flow, MD [address] [place],  [place] [address]  HISTORICAL INFORMATION:   Selected notes from the MEDICAL RECORD NUMBER Referred by Dr. Nedra Hai:  Ocular Hx- PMH-    CURRENT MEDICATIONS: No current outpatient medications on file. (Ophthalmic Drugs)   No current facility-administered medications for this visit. (Ophthalmic Drugs)   Current Outpatient Medications (Other)  Medication Sig   albuterol (PROVENTIL HFA;VENTOLIN HFA) 108 (90 BASE) MCG/ACT inhaler Inhale 1-2 puffs into the lungs every 6 (six) hours as needed for wheezing or shortness of breath.   methocarbamol (ROBAXIN) 500 MG tablet Take 1 tablet by mouth 3 (three) times daily.   pantoprazole (PROTONIX) 20 MG tablet Take by mouth.   triamcinolone cream (KENALOG) 0.1 % Apply 1 application topically 2 (two) times daily.   No current facility-administered medications for this visit. (Other)      REVIEW OF SYSTEMS:    ALLERGIES No Known Allergies  PAST MEDICAL HISTORY Past Medical History:  Diagnosis Date   Asthma    No past surgical history on file.  FAMILY HISTORY No family history on file.  SOCIAL HISTORY Social History   Tobacco Use   Smoking status: Some Days  Substance Use Topics   Alcohol use: Yes    Comment: occasionally   Drug use: No    Comment: CBD         OPHTHALMIC EXAM:  Not recorded     IMAGING AND PROCEDURES  Imaging and Procedures for 06/29/2021           ASSESSMENT/PLAN:    ICD-10-CM   1. Retinal edema  H35.81       1.  2.  3.  Ophthalmic Meds Ordered this visit:  No orders of the defined types were placed in this encounter.      No follow-ups on file.  There are no  Patient Instructions on file for this visit.   Explained the diagnoses, plan, and follow up with the patient and they expressed understanding.  Patient expressed understanding of the importance of proper follow up care.   This document serves as a record of services personally performed by Karie Chimera, MD, PhD. It was created on their behalf by De Blanch, an ophthalmic technician. The creation of this record is the provider's dictation and/or activities during the visit.    Electronically signed by: De Blanch, OA, 06/27/21  10:47 AM   Karie Chimera, M.D., Ph.D. Diseases & Surgery of the Retina and Vitreous [facility] @TODAY @     Abbreviations: M myopia (nearsighted); A astigmatism; H hyperopia (farsighted); P presbyopia; Mrx spectacle prescription;  CTL contact lenses; OD right eye; OS left eye; OU both eyes  XT exotropia; ET esotropia; PEK punctate epithelial keratitis; PEE punctate epithelial erosions; DES dry eye syndrome; MGD meibomian gland dysfunction; ATs artificial tears; PFAT's preservative free artificial tears; NSC nuclear sclerotic cataract; PSC posterior subcapsular cataract; ERM epi-retinal membrane; PVD posterior vitreous detachment; RD retinal detachment; DM diabetes mellitus; DR diabetic retinopathy; NPDR non-proliferative diabetic retinopathy; PDR proliferative diabetic retinopathy; CSME clinically significant macular edema; DME diabetic macular edema; dbh dot blot hemorrhages; CWS cotton wool spot; POAG primary open angle glaucoma; C/D  cup-to-disc ratio; HVF humphrey visual field; GVF goldmann visual field; OCT optical coherence tomography; IOP intraocular pressure; BRVO Branch retinal vein occlusion; CRVO central retinal vein occlusion; CRAO central retinal artery occlusion; BRAO branch retinal artery occlusion; RT retinal tear; SB scleral buckle; PPV pars plana vitrectomy; VH Vitreous hemorrhage; PRP panretinal laser  photocoagulation; IVK intravitreal kenalog; VMT vitreomacular traction; MH Macular hole;  NVD neovascularization of the disc; NVE neovascularization elsewhere; AREDS age related eye disease study; ARMD age related macular degeneration; POAG primary open angle glaucoma; EBMD epithelial/anterior basement membrane dystrophy; ACIOL anterior chamber intraocular lens; IOL intraocular lens; PCIOL posterior chamber intraocular lens; Phaco/IOL phacoemulsification with intraocular lens placement; PRK photorefractive keratectomy; LASIK laser assisted in situ keratomileusis; HTN hypertension; DM diabetes mellitus; COPD chronic obstructive pulmonary disease

## 2021-06-29 ENCOUNTER — Encounter (INDEPENDENT_AMBULATORY_CARE_PROVIDER_SITE_OTHER): Payer: BC Managed Care – PPO | Admitting: Ophthalmology

## 2021-06-29 DIAGNOSIS — H3581 Retinal edema: Secondary | ICD-10-CM

## 2021-06-30 NOTE — Progress Notes (Shared)
[facility] - Clinic Note  07/05/2021     CHIEF COMPLAINT Patient presents for No chief complaint on file.   HISTORY OF PRESENT ILLNESS: Troy Castillo is a 30 y.o. male who presents to the clinic today for:     Referring physician: Mateo Flow, MD [address] [place],  [place] [address]  HISTORICAL INFORMATION:   Selected notes from the MEDICAL RECORD NUMBER Referred by Dr. Elmer Picker for eval of vitreous opacities   CURRENT MEDICATIONS: No current outpatient medications on file. (Ophthalmic Drugs)   No current facility-administered medications for this visit. (Ophthalmic Drugs)   Current Outpatient Medications (Other)  Medication Sig   albuterol (PROVENTIL HFA;VENTOLIN HFA) 108 (90 BASE) MCG/ACT inhaler Inhale 1-2 puffs into the lungs every 6 (six) hours as needed for wheezing or shortness of breath.   methocarbamol (ROBAXIN) 500 MG tablet Take 1 tablet by mouth 3 (three) times daily.   pantoprazole (PROTONIX) 20 MG tablet Take by mouth.   triamcinolone cream (KENALOG) 0.1 % Apply 1 application topically 2 (two) times daily.   No current facility-administered medications for this visit. (Other)      REVIEW OF SYSTEMS:    ALLERGIES No Known Allergies  PAST MEDICAL HISTORY Past Medical History:  Diagnosis Date   Asthma    No past surgical history on file.  FAMILY HISTORY No family history on file.  SOCIAL HISTORY Social History   Tobacco Use   Smoking status: Some Days  Substance Use Topics   Alcohol use: Yes    Comment: occasionally   Drug use: No    Comment: CBD         OPHTHALMIC EXAM:  Not recorded     IMAGING AND PROCEDURES  Imaging and Procedures for 07/05/2021           ASSESSMENT/PLAN:    ICD-10-CM   1. Retinal edema  H35.81       1.  2.  3.  Ophthalmic Meds Ordered this visit:  No orders of the defined types were placed in this encounter.      No follow-ups on file.  There  are no Patient Instructions on file for this visit.   Explained the diagnoses, plan, and follow up with the patient and they expressed understanding.  Patient expressed understanding of the importance of proper follow up care.   This document serves as a record of services personally performed by Karie Chimera, MD, PhD. It was created on their behalf by Cristopher Estimable, COT an ophthalmic technician. The creation of this record is the provider's dictation and/or activities during the visit.    Electronically signed by: Cristopher Estimable, COT 11.3.22 @ 10:10 AM   Karie Chimera, M.D., Ph.D. Diseases & Surgery of the Retina and Vitreous [facility]      Abbreviations: M myopia (nearsighted); A astigmatism; H hyperopia (farsighted); P presbyopia; Mrx spectacle prescription;  CTL contact lenses; OD right eye; OS left eye; OU both eyes  XT exotropia; ET esotropia; PEK punctate epithelial keratitis; PEE punctate epithelial erosions; DES dry eye syndrome; MGD meibomian gland dysfunction; ATs artificial tears; PFAT's preservative free artificial tears; NSC nuclear sclerotic cataract; PSC posterior subcapsular cataract; ERM epi-retinal membrane; PVD posterior vitreous detachment; RD retinal detachment; DM diabetes mellitus; DR diabetic retinopathy; NPDR non-proliferative diabetic retinopathy; PDR proliferative diabetic retinopathy; CSME clinically significant macular edema; DME diabetic macular edema; dbh dot blot hemorrhages; CWS cotton wool spot; POAG primary open angle glaucoma;  C/D cup-to-disc ratio; HVF humphrey visual field; GVF goldmann visual field; OCT optical coherence tomography; IOP intraocular pressure; BRVO Branch retinal vein occlusion; CRVO central retinal vein occlusion; CRAO central retinal artery occlusion; BRAO branch retinal artery occlusion; RT retinal tear; SB scleral buckle; PPV pars plana vitrectomy; VH Vitreous hemorrhage; PRP panretinal laser photocoagulation;  IVK intravitreal kenalog; VMT vitreomacular traction; MH Macular hole;  NVD neovascularization of the disc; NVE neovascularization elsewhere; AREDS age related eye disease study; ARMD age related macular degeneration; POAG primary open angle glaucoma; EBMD epithelial/anterior basement membrane dystrophy; ACIOL anterior chamber intraocular lens; IOL intraocular lens; PCIOL posterior chamber intraocular lens; Phaco/IOL phacoemulsification with intraocular lens placement; PRK photorefractive keratectomy; LASIK laser assisted in situ keratomileusis; HTN hypertension; DM diabetes mellitus; COPD chronic obstructive pulmonary disease

## 2021-07-05 ENCOUNTER — Encounter (INDEPENDENT_AMBULATORY_CARE_PROVIDER_SITE_OTHER): Payer: BC Managed Care – PPO | Admitting: Ophthalmology

## 2021-07-05 DIAGNOSIS — H3581 Retinal edema: Secondary | ICD-10-CM

## 2021-07-15 NOTE — Progress Notes (Shared)
[facility] - Clinic Note  07/19/2021     CHIEF COMPLAINT Patient presents for No chief complaint on file.   HISTORY OF PRESENT ILLNESS: Troy Castillo is a 30 y.o. male who presents to the clinic today for:     Referring physician: Mateo Flow, MD [address] [place],  [place] [address]  HISTORICAL INFORMATION:   Selected notes from the MEDICAL RECORD NUMBER Referred by Dr. Elmer Picker for eval of vitreous opacities   CURRENT MEDICATIONS: No current outpatient medications on file. (Ophthalmic Drugs)   No current facility-administered medications for this visit. (Ophthalmic Drugs)   Current Outpatient Medications (Other)  Medication Sig   albuterol (PROVENTIL HFA;VENTOLIN HFA) 108 (90 BASE) MCG/ACT inhaler Inhale 1-2 puffs into the lungs every 6 (six) hours as needed for wheezing or shortness of breath.   methocarbamol (ROBAXIN) 500 MG tablet Take 1 tablet by mouth 3 (three) times daily.   pantoprazole (PROTONIX) 20 MG tablet Take by mouth.   triamcinolone cream (KENALOG) 0.1 % Apply 1 application topically 2 (two) times daily.   No current facility-administered medications for this visit. (Other)      REVIEW OF SYSTEMS:    ALLERGIES No Known Allergies  PAST MEDICAL HISTORY Past Medical History:  Diagnosis Date   Asthma    No past surgical history on file.  FAMILY HISTORY No family history on file.  SOCIAL HISTORY Social History   Tobacco Use   Smoking status: Some Days  Substance Use Topics   Alcohol use: Yes    Comment: occasionally   Drug use: No    Comment: CBD         OPHTHALMIC EXAM:  Not recorded     IMAGING AND PROCEDURES  Imaging and Procedures for 07/19/2021           ASSESSMENT/PLAN:    ICD-10-CM   1. Retinal edema  H35.81       1.  2.  3.  Ophthalmic Meds Ordered this visit:  No orders of the defined types were placed in this encounter.      No follow-ups on  file.  There are no Patient Instructions on file for this visit.   Explained the diagnoses, plan, and follow up with the patient and they expressed understanding.  Patient expressed understanding of the importance of proper follow up care.   This document serves as a record of services personally performed by Karie Chimera, MD, PhD. It was created on their behalf by Cristopher Estimable, COT an ophthalmic technician. The creation of this record is the provider's dictation and/or activities during the visit.    Electronically signed by: Cristopher Estimable, [place] 11.18.22 @ 9:54 AM   Karie Chimera, M.D., Ph.D. Diseases & Surgery of the Retina and Vitreous [facility]     Abbreviations: M myopia (nearsighted); A astigmatism; H hyperopia (farsighted); P presbyopia; Mrx spectacle prescription;  CTL contact lenses; OD right eye; OS left eye; OU both eyes  XT exotropia; ET esotropia; PEK punctate epithelial keratitis; PEE punctate epithelial erosions; DES dry eye syndrome; MGD meibomian gland dysfunction; ATs artificial tears; PFAT's preservative free artificial tears; NSC nuclear sclerotic cataract; PSC posterior subcapsular cataract; ERM epi-retinal membrane; PVD posterior vitreous detachment; RD retinal detachment; DM diabetes mellitus; DR diabetic retinopathy; NPDR non-proliferative diabetic retinopathy; PDR proliferative diabetic retinopathy; CSME clinically significant macular edema; DME diabetic macular edema; dbh dot blot hemorrhages; CWS cotton wool spot; POAG primary open angle glaucoma; C/D  cup-to-disc ratio; HVF humphrey visual field; GVF goldmann visual field; OCT optical coherence tomography; IOP intraocular pressure; BRVO Branch retinal vein occlusion; CRVO central retinal vein occlusion; CRAO central retinal artery occlusion; BRAO branch retinal artery occlusion; RT retinal tear; SB scleral buckle; PPV pars plana vitrectomy; VH Vitreous hemorrhage; PRP panretinal laser  photocoagulation; IVK intravitreal kenalog; VMT vitreomacular traction; MH Macular hole;  NVD neovascularization of the disc; NVE neovascularization elsewhere; AREDS age related eye disease study; ARMD age related macular degeneration; POAG primary open angle glaucoma; EBMD epithelial/anterior basement membrane dystrophy; ACIOL anterior chamber intraocular lens; IOL intraocular lens; PCIOL posterior chamber intraocular lens; Phaco/IOL phacoemulsification with intraocular lens placement; PRK photorefractive keratectomy; LASIK laser assisted in situ keratomileusis; HTN hypertension; DM diabetes mellitus; COPD chronic obstructive pulmonary disease

## 2021-07-19 ENCOUNTER — Encounter (INDEPENDENT_AMBULATORY_CARE_PROVIDER_SITE_OTHER): Payer: BC Managed Care – PPO | Admitting: Ophthalmology

## 2021-07-19 DIAGNOSIS — H3581 Retinal edema: Secondary | ICD-10-CM

## 2021-07-29 NOTE — Progress Notes (Shared)
[facility] - Clinic Note  08/02/2021     CHIEF COMPLAINT Patient presents for No chief complaint on file.  HISTORY OF PRESENT ILLNESS: Troy Castillo is a 30 y.o. male who presents to the clinic today for:    Referring physician: Mateo Flow, MD [address] [place],  [place] [address]  HISTORICAL INFORMATION:   Selected notes from the MEDICAL RECORD NUMBER Referred by Dr. Elmer Picker for eval of vitreous opacities   CURRENT MEDICATIONS: No current outpatient medications on file. (Ophthalmic Drugs)   No current facility-administered medications for this visit. (Ophthalmic Drugs)   Current Outpatient Medications (Other)  Medication Sig   albuterol (PROVENTIL HFA;VENTOLIN HFA) 108 (90 BASE) MCG/ACT inhaler Inhale 1-2 puffs into the lungs every 6 (six) hours as needed for wheezing or shortness of breath.   methocarbamol (ROBAXIN) 500 MG tablet Take 1 tablet by mouth 3 (three) times daily.   pantoprazole (PROTONIX) 20 MG tablet Take by mouth.   triamcinolone cream (KENALOG) 0.1 % Apply 1 application topically 2 (two) times daily.   No current facility-administered medications for this visit. (Other)      REVIEW OF SYSTEMS:    ALLERGIES No Known Allergies  PAST MEDICAL HISTORY Past Medical History:  Diagnosis Date   Asthma    No past surgical history on file.  FAMILY HISTORY No family history on file.  SOCIAL HISTORY Social History   Tobacco Use   Smoking status: Some Days  Substance Use Topics   Alcohol use: Yes    Comment: occasionally   Drug use: No    Comment: CBD         OPHTHALMIC EXAM:  Not recorded     IMAGING AND PROCEDURES  Imaging and Procedures for 08/02/2021           ASSESSMENT/PLAN:    ICD-10-CM   1. Retinal edema  H35.81      1.  2.  3.  Ophthalmic Meds Ordered this visit:  No orders of the defined types were placed in this encounter.     No follow-ups on file.  There are no  Patient Instructions on file for this visit.  Explained the diagnoses, plan, and follow up with the patient and they expressed understanding.  Patient expressed understanding of the importance of proper follow up care.   This document serves as a record of services personally performed by Karie Chimera, MD, PhD. It was created on their behalf by Cristopher Estimable, COT an ophthalmic technician. The creation of this record is the provider's dictation and/or activities during the visit.    Electronically signed by: Cristopher Estimable, COT 12.2.22 @ 2:14 PM   Karie Chimera, M.D., Ph.D. Diseases & Surgery of the Retina and Vitreous [facility]   Abbreviations: M myopia (nearsighted); A astigmatism; H hyperopia (farsighted); P presbyopia; Mrx spectacle prescription;  CTL contact lenses; OD right eye; OS left eye; OU both eyes  XT exotropia; ET esotropia; PEK punctate epithelial keratitis; PEE punctate epithelial erosions; DES dry eye syndrome; MGD meibomian gland dysfunction; ATs artificial tears; PFAT's preservative free artificial tears; NSC nuclear sclerotic cataract; PSC posterior subcapsular cataract; ERM epi-retinal membrane; PVD posterior vitreous detachment; RD retinal detachment; DM diabetes mellitus; DR diabetic retinopathy; NPDR non-proliferative diabetic retinopathy; PDR proliferative diabetic retinopathy; CSME clinically significant macular edema; DME diabetic macular edema; dbh dot blot hemorrhages; CWS cotton wool spot; POAG primary open angle glaucoma; C/D cup-to-disc ratio; HVF humphrey visual field; GVF  goldmann visual field; OCT optical coherence tomography; IOP intraocular pressure; BRVO Branch retinal vein occlusion; CRVO central retinal vein occlusion; CRAO central retinal artery occlusion; BRAO branch retinal artery occlusion; RT retinal tear; SB scleral buckle; PPV pars plana vitrectomy; VH Vitreous hemorrhage; PRP panretinal laser photocoagulation; IVK intravitreal  kenalog; VMT vitreomacular traction; MH Macular hole;  NVD neovascularization of the disc; NVE neovascularization elsewhere; AREDS age related eye disease study; ARMD age related macular degeneration; POAG primary open angle glaucoma; EBMD epithelial/anterior basement membrane dystrophy; ACIOL anterior chamber intraocular lens; IOL intraocular lens; PCIOL posterior chamber intraocular lens; Phaco/IOL phacoemulsification with intraocular lens placement; PRK photorefractive keratectomy; LASIK laser assisted in situ keratomileusis; HTN hypertension; DM diabetes mellitus; COPD chronic obstructive pulmonary disease

## 2021-08-02 ENCOUNTER — Encounter (INDEPENDENT_AMBULATORY_CARE_PROVIDER_SITE_OTHER): Payer: BC Managed Care – PPO | Admitting: Ophthalmology

## 2021-08-02 DIAGNOSIS — H3581 Retinal edema: Secondary | ICD-10-CM

## 2021-08-12 NOTE — Progress Notes (Signed)
[facility] - Clinic Note  08/15/2021     CHIEF COMPLAINT Patient presents for Retina Evaluation   HISTORY OF PRESENT ILLNESS: Troy Castillo is a 30 y.o. male who presents to the clinic today for:   HPI     Retina Evaluation   In left eye.  This started 5 months ago.  Duration of 5 months.  Associated Symptoms Floaters.  Context:  distance vision, mid-range vision and near vision.  Treatments tried include artificial tears.  Response to treatment was mild improvement.  I, the attending physician,  performed the HPI with the patient and updated documentation appropriately.        Comments   30 y/o male pt referred by Dr. Elmer Picker for eval of vitreous opacities.  Last saw Dr. Elmer Picker about 2 mos ago.  Pt "bumped his head" about 5 mos ago, and began seeing a few small, black floaters, mostly in his left eye.  Floaters have calmed down a bit, but whenever pt bumps his head, he sees them again.  VA excellent cc.  Denies pain, FOL.  AT prn OU.      Last edited by Rennis Chris, MD on 08/15/2021 10:17 PM.    Pt states he bumped his head a few months ago playing basketball, he states he was having light sensitivity, headaches and started seeing floaters, pt followed up with neurology who rx'd Cymbalta, but pt has not started taking it yet, pt states the floaters started to improve, but he bumped his head while driving this weekend and started seeing floaters again, he also feels like has a slight headache as well, pt denies fol  Referring physician: Dimitri Ped, MD [address] [place],  [place] [address]  HISTORICAL INFORMATION:   Selected notes from the MEDICAL RECORD NUMBER Referred by Dr. Alben Spittle for concern of floaters OS LEE:  Ocular Hx- PMH-    CURRENT MEDICATIONS: No current outpatient medications on file. (Ophthalmic Drugs)   No current facility-administered medications for this visit. (Ophthalmic Drugs)   Current Outpatient  Medications (Other)  Medication Sig   albuterol (PROVENTIL HFA;VENTOLIN HFA) 108 (90 BASE) MCG/ACT inhaler Inhale 1-2 puffs into the lungs every 6 (six) hours as needed for wheezing or shortness of breath.   Ascorbic Acid 100 MG CHEW Chew by mouth.   cyanocobalamin 1000 MCG tablet Take by mouth.   dexamethasone (DECADRON) 4 MG tablet PLEASE SEE ATTACHED FOR DETAILED DIRECTIONS   diclofenac (VOLTAREN) 75 MG EC tablet Take by mouth.   famotidine (PEPCID) 20 MG tablet TK 1 T PO BID   fluticasone (FLONASE) 50 MCG/ACT nasal spray Place 2 sprays into both nostrils daily.   ibuprofen (ADVIL) 800 MG tablet Take by mouth.   methocarbamol (ROBAXIN) 500 MG tablet Take 1 tablet by mouth 3 (three) times daily.   methocarbamol (ROBAXIN) 750 MG tablet Take 750 mg by mouth 4 (four) times daily.   nortriptyline (PAMELOR) 10 MG capsule Take 10 mg by mouth at bedtime.   Omega-3 Fatty Acids (FISH OIL) 1000 MG CAPS Take by mouth.   omeprazole (PRILOSEC) 20 MG capsule TK 1 C PO BID 30 MIN TO 1 HOUR AC FOR 2 WKS   pantoprazole (PROTONIX) 20 MG tablet Take by mouth.   pantoprazole (PROTONIX) 40 MG tablet Take by mouth.   triamcinolone cream (KENALOG) 0.1 % Apply 1 application topically 2 (two) times daily.   Turmeric 500 MG CAPS Take 1 capsule by mouth daily.  No current facility-administered medications for this visit. (Other)   REVIEW OF SYSTEMS: ROS   Positive for: Eyes, Respiratory Negative for: Constitutional, Gastrointestinal, Neurological, Skin, Genitourinary, Musculoskeletal, HENT, Endocrine, Cardiovascular, Psychiatric, Allergic/Imm, Heme/Lymph Last edited by Celine Mans, COA on 08/15/2021  1:50 PM.     ALLERGIES No Known Allergies  PAST MEDICAL HISTORY Past Medical History:  Diagnosis Date   Asthma    History reviewed. No pertinent surgical history.  FAMILY HISTORY Family History  Problem Relation Age of Onset   Glaucoma Father    SOCIAL HISTORY Social History   Tobacco Use    Smoking status: Some Days  Substance Use Topics   Alcohol use: Yes    Comment: occasionally   Drug use: No    Comment: CBD       OPHTHALMIC EXAM:  Base Eye Exam     Visual Acuity (Snellen - Linear)       Right Left   Dist cc 20/10 -2 20/15    Correction: Glasses         Tonometry (Tonopen, 1:51 PM)       Right Left   Pressure 11 13         Pupils       Dark Light Shape React APD   Right 4 3 Round Brisk None   Left 4 3 Round Brisk None         Visual Fields (Counting fingers)       Left Right    Full Full         Extraocular Movement       Right Left    Full, Ortho Full, Ortho         Neuro/Psych     Oriented x3: Yes   Mood/Affect: Normal         Dilation     Both eyes: 1.0% Mydriacyl, 2.5% Phenylephrine @ 1:51 PM           Slit Lamp and Fundus Exam     Slit Lamp Exam       Right Left   Lids/Lashes Normal Normal   Conjunctiva/Sclera White and quiet White and quiet   Cornea Trace tear film debris Trace tear film debris   Anterior Chamber deep and clear, no cell or flare deep and clear, no cell or flare   Iris Round and dilated Round and dilated   Lens Clear Clear   Anterior Vitreous mild Vitreous syneresis, no significant condesations mild Vitreous syneresis, no significant condesations         Fundus Exam       Right Left   Disc Pink and Sharp Pink and Sharp   C/D Ratio 0.5 0.4   Macula Flat, Good foveal reflex, No heme or edema Flat, Good foveal reflex, No heme or edema   Vessels mild attenuation mild attenuation   Periphery Attached, mild lattice from 0500-0800, mild WWP temporal periphery Attached, very mild lattice inferiorly from 0430-0700           Refraction     Wearing Rx       Sphere Cylinder Axis   Right -3.50 +2.00 180   Left -3.50 +1.75 012    Age: 76 mos   Type: SVL         Manifest Refraction       Sphere Cylinder Axis Dist VA   Right -3.50 +2.00 180 20/10-2   Left -3.50 +1.75 010  20/15  IMAGING AND PROCEDURES  Imaging and Procedures for 08/15/2021  OCT, Retina - OU - Both Eyes       Right Eye Quality was good. Central Foveal Thickness: 263. Progression has no prior data. Findings include normal foveal contour, no IRF, no SRF, vitreomacular adhesion .   Left Eye Quality was good. Central Foveal Thickness: 258. Progression has no prior data. Findings include normal foveal contour, no IRF, no SRF, vitreomacular adhesion .   Notes *Images captured and stored on drive  Diagnosis / Impression:  NFP; no IRF/SRF OU  Clinical management:  See below  Abbreviations: NFP - Normal foveal profile. CME - cystoid macular edema. PED - pigment epithelial detachment. IRF - intraretinal fluid. SRF - subretinal fluid. EZ - ellipsoid zone. ERM - epiretinal membrane. ORA - outer retinal atrophy. ORT - outer retinal tubulation. SRHM - subretinal hyper-reflective material. IRHM - intraretinal hyper-reflective material            ASSESSMENT/PLAN:    ICD-10-CM   1. Visual floaters, bilateral  H43.393 OCT, Retina - OU - Both Eyes    2. 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 Vitreous floaters OU  - onset ~5 mos ago following episodes of head trauma  - history of post-concussive syndrome  - no PVD; no RT/RD  - no retinal or ophthalmic interventions indicated or recommended  - monitor  2. Lattice degeneration w/ atrophic holes, both eyes - OD: mild lattice from 0500-0800 - OS: very mild lattice inferiorly from 0430-0700 - discussed findings, prognosis, and treatment options including observation - f/u in 3-4 months, sooner prn -- POV  Ophthalmic Meds Ordered this visit:  No orders of the defined types were placed in this encounter.    Return for f/u 3-4 months, lattice degeneration OU, DFE, OCT.  There are no Patient Instructions on file for this visit.   Explained the diagnoses, plan, and follow up with  the patient and they expressed understanding.  Patient expressed understanding of the importance of proper follow up care.   This document serves as a record of services personally performed by Karie Chimera, MD, PhD. It was created on their behalf by Annalee Genta, COMT. The creation of this record is the provider's dictation and/or activities during the visit.  Electronically signed by: Annalee Genta, COMT 08/15/21 10:48 PM  This document serves as a record of services personally performed by Karie Chimera, MD, PhD. It was created on their behalf by Glee Arvin. Manson Passey, OA an ophthalmic technician. The creation of this record is the provider's dictation and/or activities during the visit.    Electronically signed by: Glee Arvin. Manson Passey, [place] 12.19.2022 10:48 PM  Karie Chimera, M.D., Ph.D. Diseases & Surgery of the Retina and Vitreous [facility]  I have reviewed the above documentation for accuracy and completeness, and I agree with the above. Karie Chimera, M.D., Ph.D. 08/15/21 10:48 PM   Abbreviations: M myopia (nearsighted); A astigmatism; H hyperopia (farsighted); P presbyopia; Mrx spectacle prescription;  CTL contact lenses; OD right eye; OS left eye; OU both eyes  XT exotropia; ET esotropia; PEK punctate epithelial keratitis; PEE punctate epithelial erosions; DES dry eye syndrome; MGD meibomian gland dysfunction; ATs artificial tears; PFAT's preservative free artificial tears; NSC nuclear sclerotic cataract; PSC posterior subcapsular cataract; ERM epi-retinal membrane; PVD posterior vitreous detachment; RD retinal detachment; DM diabetes mellitus; DR diabetic retinopathy; NPDR non-proliferative diabetic retinopathy; PDR  proliferative diabetic retinopathy; CSME clinically significant macular edema; DME diabetic macular edema; dbh dot blot hemorrhages; CWS cotton wool spot; POAG primary open angle glaucoma; C/D cup-to-disc ratio; HVF humphrey visual field; GVF goldmann  visual field; OCT optical coherence tomography; IOP intraocular pressure; BRVO Branch retinal vein occlusion; CRVO central retinal vein occlusion; CRAO central retinal artery occlusion; BRAO branch retinal artery occlusion; RT retinal tear; SB scleral buckle; PPV pars plana vitrectomy; VH Vitreous hemorrhage; PRP panretinal laser photocoagulation; IVK intravitreal kenalog; VMT vitreomacular traction; MH Macular hole;  NVD neovascularization of the disc; NVE neovascularization elsewhere; AREDS age related eye disease study; ARMD age related macular degeneration; POAG primary open angle glaucoma; EBMD epithelial/anterior basement membrane dystrophy; ACIOL anterior chamber intraocular lens; IOL intraocular lens; PCIOL posterior chamber intraocular lens; Phaco/IOL phacoemulsification with intraocular lens placement; PRK photorefractive keratectomy; LASIK laser assisted in situ keratomileusis; HTN hypertension; DM diabetes mellitus; COPD chronic obstructive pulmonary disease

## 2021-08-15 ENCOUNTER — Encounter (INDEPENDENT_AMBULATORY_CARE_PROVIDER_SITE_OTHER): Payer: Self-pay | Admitting: Ophthalmology

## 2021-08-15 ENCOUNTER — Other Ambulatory Visit: Payer: Self-pay

## 2021-08-15 ENCOUNTER — Ambulatory Visit (INDEPENDENT_AMBULATORY_CARE_PROVIDER_SITE_OTHER): Payer: BC Managed Care – PPO | Admitting: Ophthalmology

## 2021-08-15 DIAGNOSIS — H35413 Lattice degeneration of retina, bilateral: Secondary | ICD-10-CM

## 2021-08-15 DIAGNOSIS — H43393 Other vitreous opacities, bilateral: Secondary | ICD-10-CM

## 2021-10-05 ENCOUNTER — Encounter (INDEPENDENT_AMBULATORY_CARE_PROVIDER_SITE_OTHER): Payer: Self-pay | Admitting: Ophthalmology
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
## 2021-10-19 ENCOUNTER — Other Ambulatory Visit: Payer: Self-pay

## 2021-10-19 ENCOUNTER — Encounter (INDEPENDENT_AMBULATORY_CARE_PROVIDER_SITE_OTHER): Payer: Self-pay | Admitting: Ophthalmology

## 2021-10-19 ENCOUNTER — Ambulatory Visit (INDEPENDENT_AMBULATORY_CARE_PROVIDER_SITE_OTHER): Payer: BC Managed Care – PPO | Admitting: Ophthalmology

## 2021-10-19 DIAGNOSIS — H43393 Other vitreous opacities, bilateral: Secondary | ICD-10-CM

## 2021-10-19 DIAGNOSIS — H35413 Lattice degeneration of retina, bilateral: Secondary | ICD-10-CM

## 2021-10-21 ENCOUNTER — Encounter (INDEPENDENT_AMBULATORY_CARE_PROVIDER_SITE_OTHER): Payer: Self-pay | Admitting: Ophthalmology
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
## 2021-11-14 ENCOUNTER — Encounter (INDEPENDENT_AMBULATORY_CARE_PROVIDER_SITE_OTHER): Payer: BC Managed Care – PPO | Admitting: Ophthalmology

## 2021-11-14 DIAGNOSIS — H35413 Lattice degeneration of retina, bilateral: Secondary | ICD-10-CM

## 2021-11-14 DIAGNOSIS — H43393 Other vitreous opacities, bilateral: Secondary | ICD-10-CM

## 2022-01-17 ENCOUNTER — Encounter (INDEPENDENT_AMBULATORY_CARE_PROVIDER_SITE_OTHER): Payer: Self-pay | Admitting: Ophthalmology
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
## 2022-03-20 ENCOUNTER — Ambulatory Visit (INDEPENDENT_AMBULATORY_CARE_PROVIDER_SITE_OTHER): Payer: BC Managed Care – PPO | Admitting: Ophthalmology

## 2022-03-20 ENCOUNTER — Encounter (INDEPENDENT_AMBULATORY_CARE_PROVIDER_SITE_OTHER): Payer: Self-pay | Admitting: Ophthalmology

## 2022-03-20 DIAGNOSIS — H43393 Other vitreous opacities, bilateral: Secondary | ICD-10-CM | POA: Diagnosis not present

## 2022-03-20 DIAGNOSIS — H35413 Lattice degeneration of retina, bilateral: Secondary | ICD-10-CM | POA: Diagnosis not present
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
## 2023-10-10 ENCOUNTER — Encounter (INDEPENDENT_AMBULATORY_CARE_PROVIDER_SITE_OTHER): Payer: BC Managed Care – PPO | Admitting: Ophthalmology

## 2023-10-10 DIAGNOSIS — H43393 Other vitreous opacities, bilateral: Secondary | ICD-10-CM

## 2023-10-10 DIAGNOSIS — H35413 Lattice degeneration of retina, bilateral: Secondary | ICD-10-CM
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
## 2024-06-09 ENCOUNTER — Encounter (INDEPENDENT_AMBULATORY_CARE_PROVIDER_SITE_OTHER): Admitting: Ophthalmology

## 2024-06-09 DIAGNOSIS — H35413 Lattice degeneration of retina, bilateral: Secondary | ICD-10-CM

## 2024-06-09 DIAGNOSIS — H43393 Other vitreous opacities, bilateral: Secondary | ICD-10-CM
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
## 2024-06-17 ENCOUNTER — Encounter (INDEPENDENT_AMBULATORY_CARE_PROVIDER_SITE_OTHER): Admitting: Ophthalmology

## 2024-06-17 DIAGNOSIS — H43393 Other vitreous opacities, bilateral: Secondary | ICD-10-CM

## 2024-06-17 DIAGNOSIS — H35413 Lattice degeneration of retina, bilateral: Secondary | ICD-10-CM
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
## 2024-06-24 ENCOUNTER — Encounter (INDEPENDENT_AMBULATORY_CARE_PROVIDER_SITE_OTHER): Admitting: Ophthalmology

## 2024-06-24 DIAGNOSIS — H35413 Lattice degeneration of retina, bilateral: Secondary | ICD-10-CM

## 2024-06-24 DIAGNOSIS — H43393 Other vitreous opacities, bilateral: Secondary | ICD-10-CM
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
## 2024-07-08 ENCOUNTER — Encounter (INDEPENDENT_AMBULATORY_CARE_PROVIDER_SITE_OTHER): Admitting: Ophthalmology

## 2024-07-08 DIAGNOSIS — H35413 Lattice degeneration of retina, bilateral: Secondary | ICD-10-CM

## 2024-07-08 DIAGNOSIS — H43393 Other vitreous opacities, bilateral: Secondary | ICD-10-CM
